# Patient Record
Sex: Female | Born: 1955 | Race: White | Hispanic: No | Marital: Married | Smoking: Never smoker
Health system: Southern US, Community
[De-identification: ages and names within clinical notes are randomized; demographics above are authoritative.]

## PROBLEM LIST (undated history)

## (undated) DIAGNOSIS — I10 Essential (primary) hypertension: Secondary | ICD-10-CM

## (undated) HISTORY — PX: ABDOMINAL HYSTERECTOMY: SHX81

---

## 2016-01-13 ENCOUNTER — Emergency Department (HOSPITAL_BASED_OUTPATIENT_CLINIC_OR_DEPARTMENT_OTHER)
Admission: EM | Admit: 2016-01-13 | Discharge: 2016-01-13 | Disposition: A | Discharge status: Home / Self Care | Payer: PRIVATE HEALTH INSURANCE | Attending: Emergency Medicine | Admitting: Emergency Medicine

## 2016-01-13 ENCOUNTER — Encounter (HOSPITAL_BASED_OUTPATIENT_CLINIC_OR_DEPARTMENT_OTHER): Payer: Self-pay | Admitting: *Deleted

## 2016-01-13 ENCOUNTER — Emergency Department (HOSPITAL_BASED_OUTPATIENT_CLINIC_OR_DEPARTMENT_OTHER): Payer: PRIVATE HEALTH INSURANCE

## 2016-01-13 DIAGNOSIS — Y999 Unspecified external cause status: Secondary | ICD-10-CM | POA: Diagnosis not present

## 2016-01-13 DIAGNOSIS — Y939 Activity, unspecified: Secondary | ICD-10-CM | POA: Insufficient documentation

## 2016-01-13 DIAGNOSIS — T148XXA Other injury of unspecified body region, initial encounter: Secondary | ICD-10-CM

## 2016-01-13 DIAGNOSIS — Z7982 Long term (current) use of aspirin: Secondary | ICD-10-CM | POA: Diagnosis not present

## 2016-01-13 DIAGNOSIS — I10 Essential (primary) hypertension: Secondary | ICD-10-CM | POA: Insufficient documentation

## 2016-01-13 DIAGNOSIS — Y9222 Religious institution as the place of occurrence of the external cause: Secondary | ICD-10-CM | POA: Diagnosis not present

## 2016-01-13 DIAGNOSIS — W19XXXA Unspecified fall, initial encounter: Secondary | ICD-10-CM

## 2016-01-13 DIAGNOSIS — S0081XA Abrasion of other part of head, initial encounter: Secondary | ICD-10-CM | POA: Insufficient documentation

## 2016-01-13 DIAGNOSIS — Z79899 Other long term (current) drug therapy: Secondary | ICD-10-CM | POA: Diagnosis not present

## 2016-01-13 DIAGNOSIS — W0110XA Fall on same level from slipping, tripping and stumbling with subsequent striking against unspecified object, initial encounter: Secondary | ICD-10-CM | POA: Diagnosis not present

## 2016-01-13 DIAGNOSIS — S0990XA Unspecified injury of head, initial encounter: Secondary | ICD-10-CM | POA: Diagnosis present

## 2016-01-13 HISTORY — DX: Essential (primary) hypertension: I10

## 2016-01-13 MED ORDER — TRAMADOL HCL 50 MG PO TABS: 50.0000 mg | ORAL_TABLET | Freq: Four times a day (QID) | ORAL | 0 refills | Status: AC | PRN

## 2016-01-13 MED ORDER — IBUPROFEN 800 MG PO TABS: 800.0000 mg | ORAL_TABLET | Freq: Three times a day (TID) | ORAL | 0 refills | Status: AC | PRN

## 2016-01-13 NOTE — Discharge Instructions (Signed)
Return here as needed.  Your CT scan did not show any abnormalities.  Use ice and heat on the areas that are sore.  Follow-up with your primary care doctor
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
## 2019-04-15 ENCOUNTER — Ambulatory Visit: Payer: BLUE CROSS/BLUE SHIELD | Attending: Internal Medicine

## 2019-04-15 DIAGNOSIS — Z23 Encounter for immunization: Secondary | ICD-10-CM

## 2019-04-15 NOTE — Progress Notes (Signed)
   Covid-19 Vaccination Clinic  Name:  [name]    MRN: [id number] DOB: Jan 28, 1955  04/15/2019  [name] was observed post Covid-19 immunization for 15 minutes without incident. She was provided with Vaccine Information Sheet and instruction to access the V-Safe system.   [name] was instructed to call 911 with any severe reactions post vaccine: Marland Kitchen Difficulty breathing  . Swelling of face and throat  . A fast heartbeat  . A bad rash all over body  . Dizziness and weakness   Immunizations Administered    Name Date Dose VIS Date Route   Pfizer COVID-19 Vaccine 04/15/2019  3:48 PM 0.3 mL 12/31/2018 Intramuscular   Manufacturer: [employer], [employer]   Lot: [id number]   NDC: [address]-6

## 2019-05-17 ENCOUNTER — Ambulatory Visit: Payer: BLUE CROSS/BLUE SHIELD | Attending: Internal Medicine

## 2019-05-17 DIAGNOSIS — Z23 Encounter for immunization: Secondary | ICD-10-CM

## 2019-05-17 NOTE — Progress Notes (Signed)
   Covid-19 Vaccination Clinic  Name:  Tammy Fields    MRN: [id number] DOB: July 02, 1955  05/17/2019  [name] was observed post Covid-19 immunization for 15 minutes without incident. She was provided with Vaccine Information Sheet and instruction to access the V-Safe system.   [name] was instructed to call 911 with any severe reactions post vaccine: Marland Kitchen Difficulty breathing  . Swelling of face and throat  . A fast heartbeat  . A bad rash all over body  . Dizziness and weakness   Immunizations Administered    Name Date Dose VIS Date Route   Pfizer COVID-19 Vaccine 05/17/2019  3:15 PM 0.3 mL 03/16/2018 Intramuscular   Manufacturer: [employer], [employer]   Lot: [id number]   NDC: [address]-9
